# Patient Record
Sex: Male | Born: 1965 | Race: Black or African American | Hispanic: No | Marital: Single | Smoking: Never smoker
Health system: Southern US, Community
[De-identification: ages and names within clinical notes are randomized; demographics above are authoritative.]

---

## 2011-05-30 ENCOUNTER — Emergency Department (HOSPITAL_COMMUNITY)
Admission: EM | Admit: 2011-05-30 | Discharge: 2011-05-30 | Disposition: A | Discharge status: Home / Self Care | Payer: Self-pay | Attending: Emergency Medicine | Admitting: Emergency Medicine

## 2011-05-30 DIAGNOSIS — L299 Pruritus, unspecified: Secondary | ICD-10-CM | POA: Insufficient documentation

## 2011-05-30 DIAGNOSIS — R21 Rash and other nonspecific skin eruption: Secondary | ICD-10-CM | POA: Insufficient documentation

## 2013-01-01 ENCOUNTER — Emergency Department (HOSPITAL_COMMUNITY): Payer: No Typology Code available for payment source

## 2013-01-01 ENCOUNTER — Emergency Department (HOSPITAL_COMMUNITY)
Admission: EM | Admit: 2013-01-01 | Discharge: 2013-01-01 | Disposition: A | Discharge status: Home / Self Care | Payer: No Typology Code available for payment source | Attending: Emergency Medicine | Admitting: Emergency Medicine

## 2013-01-01 ENCOUNTER — Encounter (HOSPITAL_COMMUNITY): Payer: Self-pay | Admitting: Emergency Medicine

## 2013-01-01 DIAGNOSIS — Y9319 Activity, other involving water and watercraft: Secondary | ICD-10-CM | POA: Insufficient documentation

## 2013-01-01 DIAGNOSIS — S199XXA Unspecified injury of neck, initial encounter: Secondary | ICD-10-CM | POA: Insufficient documentation

## 2013-01-01 DIAGNOSIS — Y92410 Unspecified street and highway as the place of occurrence of the external cause: Secondary | ICD-10-CM | POA: Insufficient documentation

## 2013-01-01 DIAGNOSIS — S0993XA Unspecified injury of face, initial encounter: Secondary | ICD-10-CM | POA: Insufficient documentation

## 2013-01-01 DIAGNOSIS — M549 Dorsalgia, unspecified: Secondary | ICD-10-CM

## 2013-01-01 DIAGNOSIS — IMO0002 Reserved for concepts with insufficient information to code with codable children: Secondary | ICD-10-CM | POA: Insufficient documentation

## 2013-01-01 MED ORDER — DIAZEPAM 5 MG PO TABS: 5.0000 mg | ORAL_TABLET | Freq: Two times a day (BID) | ORAL | Status: DC

## 2013-01-01 MED ORDER — NAPROXEN 500 MG PO TABS: 500.0000 mg | ORAL_TABLET | Freq: Two times a day (BID) | ORAL | Status: DC

## 2013-01-01 MED ORDER — HYDROCODONE-ACETAMINOPHEN 5-325 MG PO TABS: 2.0000 | ORAL_TABLET | Freq: Four times a day (QID) | ORAL | Status: DC | PRN
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
## 2013-01-01 NOTE — ED Provider Notes (Signed)
Medical screening examination/treatment/procedure(s) were performed by non-physician practitioner and as supervising physician I was immediately available for consultation/collaboration.   [name], MD 01/01/13 2242
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
## 2014-01-11 ENCOUNTER — Encounter (HOSPITAL_COMMUNITY): Payer: Self-pay | Admitting: Emergency Medicine

## 2014-01-11 ENCOUNTER — Emergency Department (INDEPENDENT_AMBULATORY_CARE_PROVIDER_SITE_OTHER)
Admission: EM | Admit: 2014-01-11 | Discharge: 2014-01-11 | Disposition: A | Discharge status: Home / Self Care | Payer: Self-pay | Source: Home / Self Care

## 2014-01-11 DIAGNOSIS — K089 Disorder of teeth and supporting structures, unspecified: Secondary | ICD-10-CM

## 2014-01-11 DIAGNOSIS — K0889 Other specified disorders of teeth and supporting structures: Secondary | ICD-10-CM

## 2014-01-11 MED ORDER — HYDROCODONE-ACETAMINOPHEN 5-325 MG PO TABS: 1.0000 | ORAL_TABLET | ORAL | Status: AC | PRN

## 2014-01-11 MED ORDER — AMOXICILLIN 500 MG PO CAPS: 1000.0000 mg | ORAL_CAPSULE | Freq: Two times a day (BID) | ORAL | Status: DC

## 2014-01-11 NOTE — Discharge Instructions (Signed)
Dental Care and Dentist Visits °Dental care supports good overall health. Regular dental visits can also help you avoid dental pain, bleeding, infection, and other more serious health problems in the future. It is important to keep the mouth healthy because diseases in the teeth, gums, and other oral tissues can spread to other areas of the body. Some problems, such as diabetes, heart disease, and pre-term labor have been associated with poor oral health.  °See your dentist every 6 months. If you experience emergency problems such as a toothache or broken tooth, go to the dentist right away. If you see your dentist regularly, you may catch problems early. It is easier to be treated for problems in the early stages.  °WHAT TO EXPECT AT A DENTIST VISIT  °Your dentist will look for many common oral health problems and recommend proper treatment. At your regular dental visit, you can expect: °· Gentle cleaning of the teeth and gums. This includes scraping and polishing. This helps to remove the sticky substance around the teeth and gums (plaque). Plaque forms in the mouth shortly after eating. Over time, plaque hardens on the teeth as tartar. If tartar is not removed regularly, it can cause problems. Cleaning also helps remove stains. °· Periodic X-rays. These pictures of the teeth and supporting bone will help your dentist assess the health of your teeth. °· Periodic fluoride treatments. Fluoride is a natural mineral shown to help strengthen teeth. Fluoride treatment involves applying a fluoride gel or varnish to the teeth. It is most commonly done in children. °· Examination of the mouth, tongue, jaws, teeth, and gums to look for any oral health problems, such as: °· Cavities (dental caries). This is decay on the tooth caused by plaque, sugar, and acid in the mouth. It is best to catch a cavity when it is small. °· Inflammation of the gums caused by plaque buildup (gingivitis). °· Problems with the mouth or malformed  or misaligned teeth. °· Oral cancer or other diseases of the soft tissues or jaws.  °KEEP YOUR TEETH AND GUMS HEALTHY °For healthy teeth and gums, follow these general guidelines as well as your dentist's specific advice: °· Have your teeth professionally cleaned at the dentist every 6 months. °· Brush twice daily with a fluoride toothpaste. °· Floss your teeth daily.  °· Ask your dentist if you need fluoride supplements, treatments, or fluoride toothpaste. °· Eat a healthy diet. Reduce foods and drinks with added sugar. °· Avoid smoking. °TREATMENT FOR ORAL HEALTH PROBLEMS °If you have oral health problems, treatment varies depending on the conditions present in your teeth and gums. °· Your caregiver will most likely recommend good oral hygiene at each visit. °· For cavities, gingivitis, or other oral health disease, your caregiver will perform a procedure to treat the problem. This is typically done at a separate appointment. Sometimes your caregiver will refer you to another dental specialist for specific tooth problems or for surgery. °SEEK IMMEDIATE DENTAL CARE IF: °· You have pain, bleeding, or soreness in the gum, tooth, jaw, or mouth area. °· A permanent tooth becomes loose or separated from the gum socket. °· You experience a blow or injury to the mouth or jaw area. °Document Released: 07/22/2011 Document Revised: 02/01/2012 Document Reviewed: 07/22/2011 °[employer]® Patient Information ©2014 [employer]. ° °Dental Pain °A tooth ache may be caused by cavities (tooth decay). Cavities expose the nerve of the tooth to air and hot or cold temperatures. It may come from an infection or abscess (also called a   boil or furuncle) around your tooth. It is also often caused by dental caries (tooth decay). This causes the pain you are having. °DIAGNOSIS  °Your caregiver can diagnose this problem by exam. °TREATMENT  °· If caused by an infection, it may be treated with medications which kill germs (antibiotics) and pain  medications as prescribed by your caregiver. Take medications as directed. °· Only take over-the-counter or prescription medicines for pain, discomfort, or fever as directed by your caregiver. °· Whether the tooth ache today is caused by infection or dental disease, you should see your dentist as soon as possible for further care. °SEEK MEDICAL CARE IF: °The exam and treatment you received today has been provided on an emergency basis only. This is not a substitute for complete medical or dental care. If your problem worsens or new problems (symptoms) appear, and you are unable to meet with your dentist, call or return to this location. °SEEK IMMEDIATE MEDICAL CARE IF:  °· You have a fever. °· You develop redness and swelling of your face, jaw, or neck. °· You are unable to open your mouth. °· You have severe pain uncontrolled by pain medicine. °MAKE SURE YOU:  °· Understand these instructions. °· Will watch your condition. °· Will get help right away if you are not doing well or get worse. °Document Released: 11/09/2005 Document Revised: 02/01/2012 Document Reviewed: 06/27/2008 °Exit[employer]® Patient Information ©2014 [employer]. ° °

## 2014-01-11 NOTE — ED Provider Notes (Signed)
Medical screening examination/treatment/procedure(s) were performed by resident physician or non-physician practitioner and as supervising physician I was immediately available for consultation/collaboration.   Thomasa Heidler DOUGLAS MD.   Keandria Berrocal D Ashlan Dignan, MD 01/11/14 2108 

## 2014-01-11 NOTE — ED Provider Notes (Signed)
CSN: [id number]     Arrival date & time 01/11/14  1631 History   First MD Initiated Contact with Patient 01/11/14 1800     Chief Complaint  Patient presents with  . Dental Pain     (Consider location/radiation/quality/duration/timing/severity/associated sxs/prior Treatment) HPI Comments: 48 year old male complaining of toothache for one week. It is calm significantly worse in the past couple of days. He states working out in the cold weather makes it worse. He points to the right lower wisdom tooth the source of pain.   History reviewed. No pertinent past medical history. History reviewed. No pertinent past surgical history. No family history on file. History  Substance Use Topics  . Smoking status: Never Smoker   . Smokeless tobacco: Not on file  . Alcohol Use: No    Review of Systems  HENT: Positive for dental problem.   All other systems reviewed and are negative.      Allergies  Review of patient's allergies indicates no known allergies.  Home Medications   Current Outpatient Rx  Name  Route  Sig  Dispense  Refill  . amoxicillin (AMOXIL) 500 MG capsule   Oral   Take 2 capsules (1,000 mg total) by mouth 2 (two) times daily.   20 capsule   0   . diazepam (VALIUM) 5 MG tablet   Oral   Take 1 tablet (5 mg total) by mouth 2 (two) times daily.   10 tablet   0   . HYDROcodone-acetaminophen (NORCO/VICODIN) 5-325 MG per tablet   Oral   Take 2 tablets by mouth every 6 (six) hours as needed for pain.   10 tablet   0   . HYDROcodone-acetaminophen (NORCO/VICODIN) 5-325 MG per tablet   Oral   Take 1 tablet by mouth every 4 (four) hours as needed.   15 tablet   0   . naproxen (NAPROSYN) 500 MG tablet   Oral   Take 1 tablet (500 mg total) by mouth 2 (two) times daily.   30 tablet   0    BP 145/88  Pulse 77  Temp(Src) 98.6 F (37 C) (Oral)  Resp 18  SpO2 96% Physical Exam  Nursing note and vitals reviewed. Constitutional: He is oriented to person,  place, and time. He appears well-developed and well-nourished. No distress.  HENT:  Mouth/Throat: Oropharynx is clear and moist.  Or signs of abscess. The right third molars are growing in various directions and cracking out of the teeth. The dental tenderness is located to a third molar there is growing anterior and angle of about 60.  Neck: Normal range of motion. Neck supple.  Neurological: He is alert and oriented to person, place, and time.  Skin: Skin is warm and dry.  Psychiatric: He has a normal mood and affect.    ED Course  Procedures (including critical care time) Labs Review Labs Reviewed - No data to display Imaging Review No results found.    MDM   Final diagnoses:  Pain, dental     Amoxicillin as dir norco for pain  5mg  #15 Must see den he is a [employer] ASAP  Adell Koval, NP 01/11/14 1824

## 2014-01-11 NOTE — ED Notes (Signed)
Pt c/o dental pain onset 6 days; right lower side Taking OTC pain meds w/no relief Alert w/no signs of acute distress.

## 2018-04-03 ENCOUNTER — Emergency Department (HOSPITAL_COMMUNITY)
Admission: EM | Admit: 2018-04-03 | Discharge: 2018-04-03 | Disposition: A | Discharge status: Home / Self Care | Payer: BLUE CROSS/BLUE SHIELD | Attending: Emergency Medicine | Admitting: Emergency Medicine

## 2018-04-03 ENCOUNTER — Emergency Department (HOSPITAL_COMMUNITY): Payer: BLUE CROSS/BLUE SHIELD

## 2018-04-03 ENCOUNTER — Encounter (HOSPITAL_COMMUNITY): Payer: Self-pay | Admitting: Emergency Medicine

## 2018-04-03 DIAGNOSIS — R1032 Left lower quadrant pain: Secondary | ICD-10-CM | POA: Diagnosis present

## 2018-04-03 DIAGNOSIS — N200 Calculus of kidney: Secondary | ICD-10-CM | POA: Diagnosis not present

## 2018-04-03 LAB — CBC
HCT: 41.8 % (ref 39.0–52.0)
Hemoglobin: 13.9 g/dL (ref 13.0–17.0)
MCH: 30.4 pg (ref 26.0–34.0)
MCHC: 33.3 g/dL (ref 30.0–36.0)
MCV: 91.5 fL (ref 78.0–100.0)
PLATELETS: 313 10*3/uL (ref 150–400)
RBC: 4.57 MIL/uL (ref 4.22–5.81)
RDW: 13.3 % (ref 11.5–15.5)
WBC: 12 10*3/uL — ABNORMAL HIGH (ref 4.0–10.5)

## 2018-04-03 LAB — URINALYSIS, ROUTINE W REFLEX MICROSCOPIC
Bacteria, UA: NONE SEEN
Bilirubin Urine: NEGATIVE
GLUCOSE, UA: NEGATIVE mg/dL
Ketones, ur: 5 mg/dL — AB
Leukocytes, UA: NEGATIVE
NITRITE: NEGATIVE
PH: 7 (ref 5.0–8.0)
Protein, ur: NEGATIVE mg/dL
SPECIFIC GRAVITY, URINE: 1.028 (ref 1.005–1.030)

## 2018-04-03 LAB — COMPREHENSIVE METABOLIC PANEL
ALBUMIN: 4.1 g/dL (ref 3.5–5.0)
ALK PHOS: 81 U/L (ref 38–126)
ALT: 14 U/L — AB (ref 17–63)
AST: 20 U/L (ref 15–41)
Anion gap: 11 (ref 5–15)
BILIRUBIN TOTAL: 0.6 mg/dL (ref 0.3–1.2)
BUN: 17 mg/dL (ref 6–20)
CALCIUM: 9.2 mg/dL (ref 8.9–10.3)
CO2: 26 mmol/L (ref 22–32)
CREATININE: 1.41 mg/dL — AB (ref 0.61–1.24)
Chloride: 101 mmol/L (ref 101–111)
GFR calc Af Amer: >60 mL/min (ref >60)
GFR calc non Af Amer: 55 mL/min — ABNORMAL LOW (ref >60)
GLUCOSE: 140 mg/dL — AB (ref 65–99)
Potassium: 3.5 mmol/L (ref 3.5–5.1)
Sodium: 138 mmol/L (ref 135–145)
TOTAL PROTEIN: 8.5 g/dL — AB (ref 6.5–8.1)

## 2018-04-03 LAB — LIPASE, BLOOD: Lipase: 25 U/L (ref 11–51)

## 2018-04-03 LAB — I-STAT CG4 LACTIC ACID, ED: Lactic Acid, Venous: 2.01 mmol/L (ref 0.5–1.9)

## 2018-04-03 MED ORDER — OXYCODONE-ACETAMINOPHEN 5-325 MG PO TABS: 1.0000 | ORAL_TABLET | ORAL | 0 refills | Status: DC | PRN

## 2018-04-03 MED ORDER — IOPAMIDOL (ISOVUE-300) INJECTION 61%
100.0000 mL | Freq: Once | INTRAVENOUS | Status: AC | PRN
  Administered 2018-04-03: 100 mL via INTRAVENOUS

## 2018-04-03 MED ORDER — ONDANSETRON HCL 4 MG/2ML IJ SOLN
4.0000 mg | Freq: Once | INTRAMUSCULAR | Status: AC
  Administered 2018-04-03: 4 mg via INTRAVENOUS
  Filled 2018-04-03: qty 2

## 2018-04-03 MED ORDER — ONDANSETRON HCL 4 MG PO TABS: 4.0000 mg | ORAL_TABLET | Freq: Four times a day (QID) | ORAL | 0 refills | Status: DC

## 2018-04-03 MED ORDER — MORPHINE SULFATE (PF) 4 MG/ML IV SOLN
4.0000 mg | Freq: Once | INTRAVENOUS | Status: AC
  Administered 2018-04-03: 4 mg via INTRAVENOUS
  Filled 2018-04-03: qty 1

## 2018-04-03 MED ORDER — TAMSULOSIN HCL 0.4 MG PO CAPS: 0.4000 mg | ORAL_CAPSULE | Freq: Every day | ORAL | 0 refills | Status: DC

## 2018-04-03 MED ORDER — IOPAMIDOL (ISOVUE-300) INJECTION 61%
INTRAVENOUS | Status: AC
  Filled 2018-04-03: qty 100

## 2018-04-03 NOTE — Discharge Instructions (Signed)
Your work-up today showed evidence of kidney stone causing her symptoms.  We did not see evidence of infection.  Given the size of the stone, we feel you are safe for discharge home with medications for outpatient medical expulsion therapy.  Please follow-up with a primary care physician.  If any symptoms change or worsen, please return to the nearest emergency department.
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
## 2018-04-03 NOTE — ED Provider Notes (Signed)
[facility]-EMERGENCY DEPT Provider Note   CSN: [id number] Arrival date & time: 04/03/18  1450     History   Chief Complaint Chief Complaint  Patient presents with  . Abdominal Pain    HPI Edward Torres is a 52 y.o. male.  The history is provided by the patient and medical records.  Flank Pain  This is a new problem. The current episode started more than 2 days ago. The problem occurs daily. The problem has not changed since onset.Associated symptoms include abdominal pain. Pertinent negatives include no chest pain, no headaches and no shortness of breath. Nothing aggravates the symptoms. Nothing relieves the symptoms. He has tried nothing for the symptoms. The treatment provided no relief.    History reviewed. No pertinent past medical history.  There are no active problems to display for this patient.   History reviewed. No pertinent surgical history.      Home Medications    Prior to Admission medications   Medication Sig Start Date End Date Taking? Authorizing Provider  amoxicillin (AMOXIL) 500 MG capsule Take 2 capsules (1,000 mg total) by mouth 2 (two) times daily. Patient not taking: Reported on 04/03/2018 01/11/14   Hayden Rasmussen, NP  diazepam (VALIUM) 5 MG tablet Take 1 tablet (5 mg total) by mouth 2 (two) times daily. Patient not taking: Reported on 04/03/2018 01/01/13   Santiago Glad, PA-C  HYDROcodone-acetaminophen (NORCO/VICODIN) 5-325 MG per tablet Take 2 tablets by mouth every 6 (six) hours as needed for pain. Patient not taking: Reported on 04/03/2018 01/01/13   Santiago Glad, PA-C  HYDROcodone-acetaminophen (NORCO/VICODIN) 5-325 MG per tablet Take 1 tablet by mouth every 4 (four) hours as needed. Patient not taking: Reported on 04/03/2018 01/11/14   Hayden Rasmussen, NP  naproxen (NAPROSYN) 500 MG tablet Take 1 tablet (500 mg total) by mouth 2 (two) times daily. Patient not taking: Reported on 04/03/2018 01/01/13   Santiago Glad, PA-C     Family History History reviewed. No pertinent family history.  Social History Social History   Tobacco Use  . Smoking status: Never Smoker  . Smokeless tobacco: Never Used  Substance Use Topics  . Alcohol use: No  . Drug use: No     Allergies   Patient has no known allergies.   Review of Systems Review of Systems  Constitutional: Negative for chills, diaphoresis, fatigue and fever.  HENT: Negative for congestion.   Eyes: Negative for visual disturbance.  Respiratory: Negative for chest tightness, shortness of breath, wheezing and stridor.   Cardiovascular: Negative for chest pain and palpitations.  Gastrointestinal: Positive for abdominal pain, constipation, nausea and vomiting. Negative for diarrhea.  Genitourinary: Positive for flank pain. Negative for dysuria and frequency.  Musculoskeletal: Negative for back pain, neck pain and neck stiffness.  Neurological: Negative for headaches.  Psychiatric/Behavioral: Negative for agitation.  All other systems reviewed and are negative.    Physical Exam Updated Vital Signs BP (!) 141/86 (BP Location: Left Arm)   Pulse 76   Temp 98.3 F (36.8 C) (Oral)   Resp 20   SpO2 94%   Physical Exam  Constitutional: He appears well-developed and well-nourished. No distress.  HENT:  Head: Normocephalic and atraumatic.  Nose: Nose normal.  Mouth/Throat: Oropharynx is clear and moist. No oropharyngeal exudate.  Eyes: Pupils are equal, round, and reactive to light. Conjunctivae and EOM are normal.  Neck: Normal range of motion.  Cardiovascular: Normal rate and intact distal pulses.  No murmur heard. Pulmonary/Chest: Effort normal. No respiratory  distress. He has no wheezes. He has no rales. He exhibits no tenderness.  Abdominal: Soft. Normal appearance. He exhibits no distension. There is tenderness in the left lower quadrant. There is no rigidity, no rebound, no guarding and no CVA tenderness.    Musculoskeletal: He exhibits  tenderness. He exhibits no edema.  Skin: Skin is warm. Capillary refill takes less than 2 seconds. He is not diaphoretic. No erythema. No pallor.  Psychiatric: He has a normal mood and affect.  Nursing note and vitals reviewed.    ED Treatments / Results  Labs (all labs ordered are listed, but only abnormal results are displayed) Labs Reviewed  COMPREHENSIVE METABOLIC PANEL - Abnormal; Notable for the following components:      Result Value   Glucose, Bld 140 (*)    Creatinine, Ser 1.41 (*)    Total Protein 8.5 (*)    ALT 14 (*)    GFR calc non Af Amer 55 (*)    All other components within normal limits  CBC - Abnormal; Notable for the following components:   WBC 12.0 (*)    All other components within normal limits  URINALYSIS, ROUTINE W REFLEX MICROSCOPIC - Abnormal; Notable for the following components:   Hgb urine dipstick MODERATE (*)    Ketones, ur 5 (*)    All other components within normal limits  I-STAT CG4 LACTIC ACID, ED - Abnormal; Notable for the following components:   Lactic Acid, Venous 2.01 (*)    All other components within normal limits  URINE CULTURE  LIPASE, BLOOD    EKG None  Radiology Ct Abdomen Pelvis W Contrast  Result Date: 04/03/2018 CLINICAL DATA:  52 year old male with LEFT abdominal pain for 1 week. History of prostate cancer and prostatectomy. EXAM: CT ABDOMEN AND PELVIS WITH CONTRAST TECHNIQUE: Multidetector CT imaging of the abdomen and pelvis was performed using the standard protocol following bolus administration of intravenous contrast. CONTRAST:  ISOVUE-300 IOPAMIDOL (ISOVUE-300) INJECTION 61% COMPARISON:  100 cc intravenous Isovue-300 FINDINGS: Lower chest: No acute abnormality Hepatobiliary: Hepatic steatosis identified without focal hepatic abnormality. The gallbladder is unremarkable. No biliary dilatation. Pancreas: Unremarkable Spleen: Unremarkable Adrenals/Urinary Tract: A 3 mm LEFT UVJ calculus causes mild LEFT  hydroureteronephrosis. Small probable renal cysts are present. The adrenal glands are unremarkable. Stomach/Bowel: Stomach is within normal limits. Appendix appears normal. No evidence of bowel wall thickening, distention, or inflammatory changes. Vascular/Lymphatic: Aortic atherosclerosis. No enlarged abdominal or pelvic lymph nodes. Reproductive: Prostatectomy changes identified. Other: No ascites, pneumoperitoneum or abscess. A small to moderate RIGHT inguinal hernia containing fat noted. Musculoskeletal: No acute or suspicious bony abnormalities identified. IMPRESSION: 1. 3 mm LEFT UVJ calculus causing mild LEFT hydroureteronephrosis. 2. Hepatic steatosis 3.  Small to moderate RIGHT inguinal hernia containing fat 4.  Aortic Atherosclerosis (ICD10-I70.0). Electronically Signed   By: Harmon Pier M.D.   On: 04/03/2018 18:03    Procedures Procedures (including critical care time)  Medications Ordered in ED Medications  ondansetron (ZOFRAN) injection 4 mg (4 mg Intravenous Given 04/03/18 1640)  morphine 4 MG/ML injection 4 mg (4 mg Intravenous Given 04/03/18 1640)  iopamidol (ISOVUE-300) 61 % injection 100 mL ( Intravenous Canceled Entry 04/03/18 1803)     Initial Impression / Assessment and Plan / ED Course  I have reviewed the triage vital signs and the nursing notes.  Pertinent labs & imaging results that were available during my care of the patient were reviewed by me and considered in my medical decision making (see chart  for details).     Edward Torres is a 52 y.o. male with a past medical history significant for prostate cancer status post robotic surgery who presents with abdominal pain.  Patient reports that for the last week he has had intermittent abdominal pain in his left abdomen.  He reports that he has had bowel movements that appeared more thin than normal but denies conservation or diarrhea.  He reports the pain gets up to a greater than 10 out of 10 in severity.  He reports is  currently 5 out of 10.  He reports that he had some nausea but no vomiting.  He denies fevers, chills, chest pain, or shortness of breath.  He denies any trauma.  He reports he got very sweaty when the pain was severe.  He says he has never had this pain before.  He reports that it is worse after he eats and drinks and is still passing flatus.  He denies any hematemesis or blood in his bowel movements.  He reports no dysuria but does report that his abdomen aches after he urinates.  He denies any other complaints.  On exam, left abdomen is tender to palpation.  No left CVA tenderness or right CVA tenderness.  Back is nontender.  Lungs are clear.  Chest is nontender.  Right-sided abdomen is nontender.  Patient denies any groin pain.  Clinically I am concerned about diverticulitis or some sort of malignancy causing the thin bowel movements and his abdominal pain.  Patient will have laboratory testing and CT imaging to further evaluate.  No history of kidney stones however will obtain urine to look for UTI  and a stone would likely be seen on CT.  Next  Patient given pain medicine, nausea medicine during initial work-up.  Anticipate reassessment after work-up.  Laboratory testing showed no evidence of UTI however there was evidence of small kidney stone.  Suspect this is the cause of pain patient felt better after medications.  As stone is small and is near the UVJ, suspect patient will be able to pass it medically.  Patient given prescription for pain medicine, nausea medicine, and Flomax.  Patient will follow-up with PCP and urology if symptoms continue.  Patient voiced understand the plan of care and feels much better.  Patient had no depressions or concerns and was discharged in good condition after discovery of his nephrolithiasis causing his abdominal pain.   Final Clinical Impressions(s) / ED Diagnoses   Final diagnoses:  Left lower quadrant pain  Kidney stone on left side    ED Discharge  Orders        Ordered    tamsulosin (FLOMAX) 0.4 MG CAPS capsule  Daily     04/03/18 2031    oxyCODONE-acetaminophen (PERCOCET/ROXICET) 5-325 MG tablet  Every 4 hours PRN     04/03/18 2031    ondansetron (ZOFRAN) 4 MG tablet  Every 6 hours     04/03/18 2031      Clinical Impression: 1. Left lower quadrant pain   2. Kidney stone on left side     Disposition: Discharge  Condition: Good  I have discussed the results, Dx and Tx plan with the pt(& family if present). He/she/they expressed understanding and agree(s) with the plan. Discharge instructions discussed at great length. Strict return precautions discussed and pt &/or family have verbalized understanding of the instructions. No further questions at time of discharge.    Discharge Medication List as of 04/03/2018  8:31 PM  START taking these medications   Details  ondansetron (ZOFRAN) 4 MG tablet Take 1 tablet (4 mg total) by mouth every 6 (six) hours., Starting Sun 04/03/2018, Print    oxyCODONE-acetaminophen (PERCOCET/ROXICET) 5-325 MG tablet Take 1 tablet by mouth every 4 (four) hours as needed for severe pain., Starting Sun 04/03/2018, Print    tamsulosin (FLOMAX) 0.4 MG CAPS capsule Take 1 capsule (0.4 mg total) by mouth daily., Starting Sun 04/03/2018, Print        Follow Up: [facility] [address] [place] [address] [phone] Schedule an appointment as soon as possible for a visit    [facility] [address] [phone] mc [place] [address] [phone]       Neeka Urista, Canary Brim, MD 04/03/18 [phone]

## 2018-04-03 NOTE — ED Notes (Signed)
Pt aware urine sample is needed, urinal at bedside. 

## 2018-04-03 NOTE — ED Notes (Signed)
Patient transported to CT 

## 2018-04-05 LAB — URINE CULTURE: CULTURE: NO GROWTH

## 2018-11-17 ENCOUNTER — Ambulatory Visit (HOSPITAL_COMMUNITY)
Admission: EM | Admit: 2018-11-17 | Discharge: 2018-11-17 | Disposition: A | Discharge status: Home / Self Care | Payer: BLUE CROSS/BLUE SHIELD | Attending: Family Medicine | Admitting: Family Medicine

## 2018-11-17 ENCOUNTER — Encounter (HOSPITAL_COMMUNITY): Payer: Self-pay | Admitting: Emergency Medicine

## 2018-11-17 DIAGNOSIS — S0990XA Unspecified injury of head, initial encounter: Secondary | ICD-10-CM | POA: Diagnosis not present

## 2018-11-17 DIAGNOSIS — S0101XA Laceration without foreign body of scalp, initial encounter: Secondary | ICD-10-CM | POA: Diagnosis not present
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
## 2018-11-17 NOTE — Discharge Instructions (Signed)
  Please seek prompt medical care if: You have: A very bad (severe) headache that is not helped by medicine. Trouble walking or weakness in your arms and legs. Clear or bloody fluid coming from your nose or ears. Changes in your seeing (vision). Jerky movements that you cannot control (seizure). You throw up (vomit). Your symptoms get worse. You lose balance. Your speech is slurred. You pass out. You are sleepier and have trouble staying awake. The black centers of your eyes (pupils) change in size.  These symptoms may be an emergency. Do not wait to see if the symptoms will go away. Get medical help right away. Call your local emergency services. Do not drive yourself to the hospital.  

## 2018-11-19 ENCOUNTER — Telehealth (HOSPITAL_COMMUNITY): Payer: Self-pay | Admitting: Emergency Medicine
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
## 2018-11-20 ENCOUNTER — Emergency Department (HOSPITAL_COMMUNITY)
Admission: EM | Admit: 2018-11-20 | Discharge: 2018-11-20 | Disposition: A | Discharge status: Home / Self Care | Payer: BLUE CROSS/BLUE SHIELD | Attending: Emergency Medicine | Admitting: Emergency Medicine

## 2018-11-20 ENCOUNTER — Encounter (HOSPITAL_COMMUNITY): Payer: Self-pay

## 2018-11-20 ENCOUNTER — Emergency Department (HOSPITAL_COMMUNITY): Payer: BLUE CROSS/BLUE SHIELD

## 2018-11-20 DIAGNOSIS — Y999 Unspecified external cause status: Secondary | ICD-10-CM | POA: Diagnosis not present

## 2018-11-20 DIAGNOSIS — S0193XA Puncture wound without foreign body of unspecified part of head, initial encounter: Secondary | ICD-10-CM

## 2018-11-20 DIAGNOSIS — R51 Headache: Secondary | ICD-10-CM | POA: Diagnosis present

## 2018-11-20 DIAGNOSIS — Y929 Unspecified place or not applicable: Secondary | ICD-10-CM | POA: Insufficient documentation

## 2018-11-20 DIAGNOSIS — Z79899 Other long term (current) drug therapy: Secondary | ICD-10-CM | POA: Diagnosis not present

## 2018-11-20 DIAGNOSIS — Y249XXA Unspecified firearm discharge, undetermined intent, initial encounter: Secondary | ICD-10-CM | POA: Diagnosis not present

## 2018-11-20 DIAGNOSIS — S0180XA Unspecified open wound of other part of head, initial encounter: Secondary | ICD-10-CM | POA: Diagnosis not present

## 2018-11-20 DIAGNOSIS — Y939 Activity, unspecified: Secondary | ICD-10-CM | POA: Insufficient documentation

## 2018-11-20 DIAGNOSIS — W3400XA Accidental discharge from unspecified firearms or gun, initial encounter: Secondary | ICD-10-CM

## 2018-11-20 NOTE — ED Notes (Signed)
Pt stable, ambulatory, states understanding of discharge instructions 
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
## 2019-07-27 ENCOUNTER — Ambulatory Visit (HOSPITAL_COMMUNITY)
Admission: EM | Admit: 2019-07-27 | Discharge: 2019-07-27 | Disposition: A | Discharge status: Home / Self Care | Payer: BC Managed Care – PPO | Attending: Internal Medicine | Admitting: Internal Medicine

## 2019-07-27 ENCOUNTER — Encounter (HOSPITAL_COMMUNITY): Payer: Self-pay

## 2019-07-27 ENCOUNTER — Other Ambulatory Visit: Payer: Self-pay

## 2019-07-27 DIAGNOSIS — M5431 Sciatica, right side: Secondary | ICD-10-CM | POA: Diagnosis not present

## 2019-07-27 MED ORDER — DEXAMETHASONE SODIUM PHOSPHATE 10 MG/ML IJ SOLN
10.0000 mg | Freq: Once | INTRAMUSCULAR | Status: AC
  Administered 2019-07-27: 19:00:00 10 mg via INTRAMUSCULAR

## 2019-07-27 MED ORDER — KETOROLAC TROMETHAMINE 60 MG/2ML IM SOLN
60.0000 mg | Freq: Once | INTRAMUSCULAR | Status: AC
  Administered 2019-07-27: 19:00:00 60 mg via INTRAMUSCULAR

## 2019-07-27 MED ORDER — DEXAMETHASONE SODIUM PHOSPHATE 10 MG/ML IJ SOLN
INTRAMUSCULAR | Status: AC
  Filled 2019-07-27: qty 1

## 2019-07-27 MED ORDER — KETOROLAC TROMETHAMINE 60 MG/2ML IM SOLN
INTRAMUSCULAR | Status: AC
  Filled 2019-07-27: qty 2

## 2019-07-27 MED ORDER — NAPROXEN 375 MG PO TABS: 375.0000 mg | ORAL_TABLET | Freq: Two times a day (BID) | ORAL | 0 refills | Status: AC

## 2019-07-27 MED ORDER — PREDNISONE 20 MG PO TABS: 20.0000 mg | ORAL_TABLET | Freq: Every day | ORAL | 0 refills | Status: AC

## 2019-07-27 NOTE — ED Triage Notes (Addendum)
Pt states he has right leg pain x 2 days. Pt states the pain is radiating up to his buttock.

## 2019-07-27 NOTE — ED Provider Notes (Addendum)
[facility]    CSN: [id number] Arrival date & time: 07/27/19  1800      History   Chief Complaint Chief Complaint  Patient presents with  . Leg Pain    HPI Edward Torres is a 53 y.o. male with no past medical history comes to urgent care with a 2-day history of right gluteal pain.  Pain is 10/10 in severity.  Pain started fairly rapidly and has been persistent over the past couple of days.  Pain is worsened by movement.  Pain radiates to the right foot.  No known relieving factors.  Pain is sharp and shooting into the right foot.  No numbness or weakness in the right leg.  Patient denies any trauma in the back.  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 HPI  History reviewed. No pertinent past medical history.  There are no active problems to display for this patient.   History reviewed. No pertinent surgical history.     Home Medications    Prior to Admission medications   Medication Sig Start Date End Date Taking? Authorizing Provider  HYDROcodone-acetaminophen (NORCO/VICODIN) 5-325 MG per tablet Take 1 tablet by mouth every 4 (four) hours as needed. Patient not taking: Reported on 04/03/2018 01/11/14   Janne Napoleon, NP  naproxen (NAPROSYN) 375 MG tablet Take 1 tablet (375 mg total) by mouth 2 (two) times daily. 07/27/19   Darsha Zumstein, Myrene Galas, MD  predniSONE (DELTASONE) 20 MG tablet Take 1 tablet (20 mg total) by mouth daily for 3 days. 07/27/19 07/30/19  Chase Picket, MD    Family History History reviewed. No pertinent family history.  Social History Social History   Tobacco Use  . Smoking status: Never Smoker  . Smokeless tobacco: Never Used  Substance Use Topics  . Alcohol use: No  . Drug use: No     Allergies   Patient has no known allergies.   Review of Systems Review of Systems  Constitutional: Positive for activity change. Negative for chills, fatigue and fever.  HENT: Negative.   Respiratory: Negative.    Cardiovascular: Negative.   Gastrointestinal: Negative.   Genitourinary: Negative.   Musculoskeletal: Negative for arthralgias, gait problem, joint swelling and myalgias.  Skin: Negative.   Neurological: Negative for dizziness, weakness and light-headedness.     Physical Exam Triage Vital Signs ED Triage Vitals  Enc Vitals Group     BP 07/27/19 1828 (!) 189/76     Pulse Rate 07/27/19 1828 86     Resp 07/27/19 1828 18     Temp 07/27/19 1828 99.2 F (37.3 C)     Temp Source 07/27/19 1828 Oral     SpO2 07/27/19 1828 100 %     Weight 07/27/19 1826 223 lb (101.2 kg)     Height --      Head Circumference --      Peak Flow --      Pain Score 07/27/19 1826 10     Pain Loc --      Pain Edu? --      Excl. in [place]? --    No data found.  Updated Vital Signs BP (!) 189/76 (BP Location: Right Arm)   Pulse 86   Temp 99.2 F (37.3 C) (Oral)   Resp 18   Wt 101.2 kg   SpO2 100%   Visual Acuity Right Eye Distance:   Left Eye Distance:   Bilateral Distance:    Right Eye Near:  Left Eye Near:    Bilateral Near:     Physical Exam Vitals signs and nursing note reviewed.  Constitutional:      General: He is in acute distress.     Appearance: He is obese. He is ill-appearing. He is not toxic-appearing.  Cardiovascular:     Rate and Rhythm: Normal rate and regular rhythm.     Pulses: Normal pulses.     Heart sounds: No murmur.  Pulmonary:     Effort: Pulmonary effort is normal. No respiratory distress.     Breath sounds: Normal breath sounds. No rhonchi or rales.  Abdominal:     General: Bowel sounds are normal. There is no distension.     Palpations: Abdomen is soft.     Tenderness: There is no guarding.  Musculoskeletal: Normal range of motion.        General: No swelling, tenderness or deformity.     Right lower leg: No edema.  Skin:    General: Skin is warm.     Capillary Refill: Capillary refill takes less than 2 seconds.  Neurological:     General: No focal deficit  present.     Mental Status: He is alert.     Cranial Nerves: No cranial nerve deficit.     Sensory: No sensory deficit.     Gait: Gait abnormal.     Deep Tendon Reflexes: Reflexes normal.      UC Treatments / Results  Labs (all labs ordered are listed, but only abnormal results are displayed) Labs Reviewed - No data to display  EKG   Radiology No results found.  Procedures Procedures (including critical care time)  Medications Ordered in UC Medications  ketorolac (TORADOL) injection 60 mg (60 mg Intramuscular Given 07/27/19 1917)  dexamethasone (DECADRON) injection 10 mg (10 mg Intramuscular Given 07/27/19 1916)  dexamethasone (DECADRON) 10 MG/ML injection (has no administration in time range)  ketorolac (TORADOL) 60 MG/2ML injection (has no administration in time range)    Initial Impression / Assessment and Plan / UC Course  I have reviewed the triage vital signs and the nursing notes.  Pertinent labs & imaging results that were available during my care of the patient were reviewed by me and considered in my medical decision making (see chart for details).     1.  Sciatica in the right leg: Dexamethasone 10 mg IM x1 dose Toradol 60 mg IM x1 dose Naproxen 375 mg twice daily Prednisone 20 mg orally daily x3 days Gentle range of motion exercises Patient has been given a work excuse for a couple of days.  If patient's pain gets worse or he experiences increasing numbness or weakness he is advised to return to urgent care or emergency department to be reevaluated and treated.  2.  Elevated blood pressure without the diagnosis of hypertension: Blood pressure is elevated likely secondary to pain. Patient is advised to monitor blood pressure on a regular basis.  If it remains elevated he may benefit from an antihypertensive agent.  Currently he is in pain.  He denies any headaches, chest pain, shortness of breath. Final Clinical Impressions(s) / UC Diagnoses   Final  diagnoses:  Sciatica of right side   Discharge Instructions   None    ED Prescriptions    Medication Sig Dispense Auth. Provider   naproxen (NAPROSYN) 375 MG tablet Take 1 tablet (375 mg total) by mouth 2 (two) times daily. 20 tablet Baxter Gonzalez, Britta Mccreedy, MD   predniSONE (DELTASONE) 20 MG tablet  Take 1 tablet (20 mg total) by mouth daily for 3 days. 3 tablet Nyheem Binette, Britta MccreedyPhilip O, MD     Controlled Substance Prescriptions [place] Controlled Substance Registry consulted? No   Merrilee JanskyLamptey, Teng Decou O, MD 07/27/19 Norberta Keens[birth year]21    Merrilee JanskyLamptey, Annell Canty O, MD 07/27/19 [phone]
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
## 2019-10-09 ENCOUNTER — Other Ambulatory Visit: Payer: Self-pay

## 2019-10-09 DIAGNOSIS — Z20822 Contact with and (suspected) exposure to covid-19: Secondary | ICD-10-CM

## 2019-10-11 LAB — NOVEL CORONAVIRUS, NAA: SARS-CoV-2, NAA: NOT DETECTED
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
## 2024-09-06 NOTE — Progress Notes (Signed)
 Transitional Care Management- Unable to Reach patient   Unable to reach patient after 2 telephone attempt(s).   Letter mailed/sent via mychart to patient   Is this visit eligible for TCM? No

## 2024-09-06 NOTE — Telephone Encounter (Signed)
 mailed

## 2024-09-12 ENCOUNTER — Encounter (HOSPITAL_COMMUNITY): Payer: Self-pay | Admitting: *Deleted

## 2024-09-12 ENCOUNTER — Other Ambulatory Visit: Payer: Self-pay

## 2024-09-12 ENCOUNTER — Ambulatory Visit (HOSPITAL_COMMUNITY)
Admission: EM | Admit: 2024-09-12 | Discharge: 2024-09-12 | Disposition: A | Discharge status: Home / Self Care | Payer: Self-pay | Attending: Internal Medicine | Admitting: Internal Medicine

## 2024-09-12 DIAGNOSIS — Z4802 Encounter for removal of sutures: Secondary | ICD-10-CM

## 2024-09-12 DIAGNOSIS — S61219A Laceration without foreign body of unspecified finger without damage to nail, initial encounter: Secondary | ICD-10-CM

## 2024-09-12 DIAGNOSIS — S61210A Laceration without foreign body of right index finger without damage to nail, initial encounter: Secondary | ICD-10-CM

## 2024-09-12 DIAGNOSIS — L089 Local infection of the skin and subcutaneous tissue, unspecified: Secondary | ICD-10-CM

## 2024-09-12 MED ORDER — SULFAMETHOXAZOLE-TRIMETHOPRIM 800-160 MG PO TABS: 1.0000 | ORAL_TABLET | Freq: Two times a day (BID) | ORAL | 0 refills | Status: AC

## 2024-09-12 MED ORDER — MUPIROCIN 2 % EX OINT: 1.0000 | TOPICAL_OINTMENT | Freq: Two times a day (BID) | CUTANEOUS | 0 refills | Status: AC

## 2024-09-12 MED ORDER — BUPIVACAINE HCL (PF) 0.5 % IJ SOLN
INTRAMUSCULAR | Status: AC
  Filled 2024-09-12: qty 10

## 2024-09-12 NOTE — Discharge Instructions (Addendum)
 Take Bactrim antibiotic every 12 hours for the next 10 days.  Apply mupirocin ointment to the wound of the right finger every 12 hours for the next 10 days.  Keep the wound dressed with a bandage during the daytime, take the bandage off at nighttime.  Use Epsom salt soaks to the right hand every 6 hours with warm water and Epsom salt.  Please call Dr. Lorretta hand specialist tomorrow to schedule an appointment for as soon as possible for reevaluation.  If you develop new or worsening numbness and tingling to the tip of the right finger, worsening swelling of the right finger, or worsening pain/streaking redness to the base of the right finger or the right hand, I would like for you to go to the nearest emergency room.

## 2024-09-12 NOTE — ED Notes (Addendum)
 During removal of sutures yellow drainage came out of suture site. Provider was notified of drainage from site.
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
## 2024-09-12 NOTE — ED Triage Notes (Signed)
 PT presents to have sutures removed from RT index finger. Pt reports his finger feels numb and he has a hard time moving the finger. Sutures placed at [facility]
